# Patient Record
Sex: Female | Born: 1965 | Race: White | Hispanic: No | Smoking: Current every day smoker
Health system: Southern US, Community
[De-identification: ages and names within clinical notes are randomized; demographics above are authoritative.]

## PROBLEM LIST (undated history)

## (undated) DIAGNOSIS — F32A Depression, unspecified: Secondary | ICD-10-CM

## (undated) DIAGNOSIS — C801 Malignant (primary) neoplasm, unspecified: Secondary | ICD-10-CM

## (undated) DIAGNOSIS — F329 Major depressive disorder, single episode, unspecified: Secondary | ICD-10-CM

## (undated) DIAGNOSIS — F1010 Alcohol abuse, uncomplicated: Secondary | ICD-10-CM

## (undated) HISTORY — PX: APPENDECTOMY: SHX54

## (undated) HISTORY — PX: GASTRIC BYPASS: SHX52

## (undated) HISTORY — PX: TONSILLECTOMY: SUR1361

## (undated) HISTORY — PX: MOHS SURGERY: SUR867

---

## 2005-05-24 ENCOUNTER — Ambulatory Visit: Payer: Self-pay | Admitting: Gynecology

## 2013-02-18 ENCOUNTER — Ambulatory Visit: Payer: Self-pay

## 2014-08-28 ENCOUNTER — Ambulatory Visit
Admission: EM | Admit: 2014-08-28 | Discharge: 2014-08-28 | Disposition: A | Discharge status: Home / Self Care | Payer: Federal, State, Local not specified - PPO | Attending: Family Medicine | Admitting: Family Medicine

## 2014-08-28 ENCOUNTER — Ambulatory Visit
Admit: 2014-08-28 | Discharge: 2014-08-28 | Disposition: A | Discharge status: Home / Self Care | Payer: Federal, State, Local not specified - PPO | Attending: Family Medicine | Admitting: Family Medicine

## 2014-08-28 ENCOUNTER — Other Ambulatory Visit: Payer: Self-pay

## 2014-08-28 ENCOUNTER — Ambulatory Visit: Payer: Federal, State, Local not specified - PPO

## 2014-08-28 DIAGNOSIS — K449 Diaphragmatic hernia without obstruction or gangrene: Secondary | ICD-10-CM | POA: Diagnosis not present

## 2014-08-28 DIAGNOSIS — R079 Chest pain, unspecified: Secondary | ICD-10-CM | POA: Diagnosis not present

## 2014-08-28 DIAGNOSIS — K2970 Gastritis, unspecified, without bleeding: Secondary | ICD-10-CM | POA: Diagnosis not present

## 2014-08-28 HISTORY — DX: Malignant (primary) neoplasm, unspecified: C80.1

## 2014-08-28 HISTORY — DX: Depression, unspecified: F32.A

## 2014-08-28 HISTORY — DX: Major depressive disorder, single episode, unspecified: F32.9

## 2014-08-28 LAB — CBC WITH DIFFERENTIAL/PLATELET
BASOS ABS: 0.1 10*3/uL (ref 0–0.1)
Basophils Relative: 1 %
Eosinophils Absolute: 0.3 10*3/uL (ref 0–0.7)
Eosinophils Relative: 4 %
HEMATOCRIT: 42.8 % (ref 35.0–47.0)
Hemoglobin: 14.3 g/dL (ref 12.0–16.0)
Lymphocytes Relative: 30 %
Lymphs Abs: 2.3 10*3/uL (ref 1.0–3.6)
MCH: 29.8 pg (ref 26.0–34.0)
MCHC: 33.4 g/dL (ref 32.0–36.0)
MCV: 89.1 fL (ref 80.0–100.0)
MONOS PCT: 8 %
Monocytes Absolute: 0.6 10*3/uL (ref 0.2–0.9)
NEUTROS PCT: 57 %
Neutro Abs: 4.3 10*3/uL (ref 1.4–6.5)
Platelets: 204 10*3/uL (ref 150–440)
RBC: 4.81 MIL/uL (ref 3.80–5.20)
RDW: 13.3 % (ref 11.5–14.5)
WBC: 7.6 10*3/uL (ref 3.6–11.0)

## 2014-08-28 LAB — CK: CK TOTAL: 60 U/L (ref 38–234)

## 2014-08-28 LAB — BASIC METABOLIC PANEL
Anion gap: 10 (ref 5–15)
BUN: 11 mg/dL (ref 6–20)
CO2: 25 mmol/L (ref 22–32)
CREATININE: 0.91 mg/dL (ref 0.44–1.00)
Calcium: 8.8 mg/dL — ABNORMAL LOW (ref 8.9–10.3)
Chloride: 102 mmol/L (ref 101–111)
GFR calc non Af Amer: >60 mL/min (ref >60)
GLUCOSE: 115 mg/dL — AB (ref 65–99)
POTASSIUM: 4 mmol/L (ref 3.5–5.1)
SODIUM: 137 mmol/L (ref 135–145)

## 2014-08-28 LAB — TROPONIN I: Troponin I: <0.03 ng/mL (ref <0.031)

## 2014-08-28 LAB — FIBRIN DERIVATIVES D-DIMER (ARMC ONLY): Fibrin derivatives D-dimer (ARMC): 507 — ABNORMAL HIGH (ref 0–499)

## 2014-08-28 LAB — CKMB (ARMC ONLY): CK, MB: 1.1 ng/mL (ref 0.5–5.0)

## 2014-08-28 MED ORDER — IOHEXOL 350 MG/ML SOLN
100.0000 mL | Freq: Once | INTRAVENOUS | Status: AC | PRN
  Administered 2014-08-28: 100 mL via INTRAVENOUS

## 2014-08-28 MED ORDER — GI COCKTAIL ~~LOC~~
30.0000 mL | Freq: Once | ORAL | Status: AC
  Administered 2014-08-28: 30 mL via ORAL

## 2014-08-28 NOTE — Discharge Instructions (Signed)
Gastritis, Adult Gastritis is soreness and swelling (inflammation) of the lining of the stomach. Gastritis can develop as a sudden onset (acute) or long-term (chronic) condition. If gastritis is not treated, it can lead to stomach bleeding and ulcers. CAUSES  Gastritis occurs when the stomach lining is weak or damaged. Digestive juices from the stomach then inflame the weakened stomach lining. The stomach lining may be weak or damaged due to viral or bacterial infections. One common bacterial infection is the Helicobacter pylori infection. Gastritis can also result from excessive alcohol consumption, taking certain medicines, or having too much acid in the stomach.  SYMPTOMS  In some cases, there are no symptoms. When symptoms are present, they may include:  Pain or a burning sensation in the upper abdomen.  Nausea.  Vomiting.  An uncomfortable feeling of fullness after eating. DIAGNOSIS  Your caregiver may suspect you have gastritis based on your symptoms and a physical exam. To determine the cause of your gastritis, your caregiver may perform the following:  Blood or stool tests to check for the H pylori bacterium.  Gastroscopy. A thin, flexible tube (endoscope) is passed down the esophagus and into the stomach. The endoscope has a light and camera on the end. Your caregiver uses the endoscope to view the inside of the stomach.  Taking a tissue sample (biopsy) from the stomach to examine under a microscope. TREATMENT  Depending on the cause of your gastritis, medicines may be prescribed. If you have a bacterial infection, such as an H pylori infection, antibiotics may be given. If your gastritis is caused by too much acid in the stomach, H2 blockers or antacids may be given. Your caregiver may recommend that you stop taking aspirin, ibuprofen, or other nonsteroidal anti-inflammatory drugs (NSAIDs). HOME CARE INSTRUCTIONS  Only take over-the-counter or prescription medicines as directed by  your caregiver.  If you were given antibiotic medicines, take them as directed. Finish them even if you start to feel better.  Drink enough fluids to keep your urine clear or pale yellow.  Avoid foods and drinks that make your symptoms worse, such as:  Caffeine or alcoholic drinks.  Chocolate.  Peppermint or mint flavorings.  Garlic and onions.  Spicy foods.  Citrus fruits, such as oranges, lemons, or limes.  Tomato-based foods such as sauce, chili, salsa, and pizza.  Fried and fatty foods.  Eat small, frequent meals instead of large meals. SEEK IMMEDIATE MEDICAL CARE IF:   You have black or dark red stools.  You vomit blood or material that looks like coffee grounds.  You are unable to keep fluids down.  Your abdominal pain gets worse.  You have a fever.  You do not feel better after 1 week.  You have any other questions or concerns. MAKE SURE YOU:  Understand these instructions.  Will watch your condition.  Will get help right away if you are not doing well or get worse. Document Released: 01/26/2001 Document Revised: 08/03/2011 Document Reviewed: 03/17/2011 [facility] Patient Information 2015 [place], [place]. This information is not intended to replace advice given to you by your health care provider. Make sure you discuss any questions you have with your health care provider.   GO TO GET CT OF CHEST AT THIS TIME AT [facility].

## 2014-08-28 NOTE — ED Notes (Signed)
Pt states "I woke with morning with clavicle pain, chest tightness, shortness of breath and slightly dizzy. I did take 3 ibuprofen with some red wine before I went to bed."

## 2014-08-28 NOTE — ED Provider Notes (Addendum)
Patient presents today with symptoms of substernal chest discomfort, epigastric discomfort, mid back pain, and being slightly lightheaded when she woke up this morning. Patient states that she took 3 Ibuprofen and red wine before laying her head down for bed last night. She states she took theIibuprofen for sinus pressure she was having because she forgot to take her Allegra-D that morning. She denies any severe headache, shortness of breath, nausea, vomiting, diaphoresis. She admits to past history of smoking for 15 years. Does drink wine every night. She does have a history of acid reflux and does take Zantac for her symptoms usually. She has not taken Zantac for her symptoms that started yesterday. Today in the office she states that her symptoms are still present as far as the chest discomfort, epigastric discomfort and mid back pain. She does not appear to be in any distress. She denies any cardiac history except for high cholesterol which she does not treat with medication. She admits to family history of coronary artery disease. She denies any family history of death from cardiac source before the age of 73.  Review systems negative except mentioned above. Vitals as per Epic.  GENERAL: NAD HEENT: no pharyngeal erythema, no exudate, no erythema of TMs, no cervical LAD RESP: CTA B, no tenderness on palpation to chest  CARD: RRR MHW:[id number] bowel sounds, mild to moderate epigastric tenderness on palpation, no rebound or guarding  NEURO: CN II-XII groslly intact   A/P: Chest discomfort, epigastric discomfort-patient's symptoms improved some with GI cocktail in the office, and labs show slightly elevated blood sugar (patient was fasting), and slightly elevated d-dimer. Cardiac enzymes were negative. Patient is being sent over to [facility] for CT chest to evaluate for PE.  Paulina Fusi, MD 08/28/14 1319  CT results do not show PE. There is a possible soft tissue lesion  noted in the right lung by radiologist. Recommend repeat CT without contrast in a few weeks. This was discussed with the patient and she will follow up with her primary care physician regarding this. Patient does state that she is feeling slightly better after taking the GI cocktail. If her symptoms were to worsen she knows to go to the ER. I have advised her to avoid things that could make her reflux or gastritis worse.  Paulina Fusi, MD 08/28/14 [phone]

## 2014-08-28 NOTE — ED Notes (Signed)
Pt resting quietly, states pain in epigastric area and between clavicles resolved after adm of GI cocktail. Awaiting lab results.

## 2019-05-01 ENCOUNTER — Emergency Department
Admission: EM | Admit: 2019-05-01 | Discharge: 2019-05-01 | Disposition: A | Discharge status: Home / Self Care | Payer: Federal, State, Local not specified - PPO | Attending: Emergency Medicine | Admitting: Emergency Medicine

## 2019-05-01 ENCOUNTER — Emergency Department: Payer: Federal, State, Local not specified - PPO

## 2019-05-01 ENCOUNTER — Encounter: Payer: Self-pay | Admitting: Emergency Medicine

## 2019-05-01 ENCOUNTER — Other Ambulatory Visit: Payer: Self-pay

## 2019-05-01 DIAGNOSIS — F17210 Nicotine dependence, cigarettes, uncomplicated: Secondary | ICD-10-CM | POA: Insufficient documentation

## 2019-05-01 DIAGNOSIS — N3001 Acute cystitis with hematuria: Secondary | ICD-10-CM | POA: Insufficient documentation

## 2019-05-01 DIAGNOSIS — Z79899 Other long term (current) drug therapy: Secondary | ICD-10-CM | POA: Diagnosis not present

## 2019-05-01 DIAGNOSIS — R300 Dysuria: Secondary | ICD-10-CM | POA: Diagnosis present

## 2019-05-01 DIAGNOSIS — Z85828 Personal history of other malignant neoplasm of skin: Secondary | ICD-10-CM | POA: Insufficient documentation

## 2019-05-01 LAB — URINALYSIS, COMPLETE (UACMP) WITH MICROSCOPIC
Bacteria, UA: NONE SEEN
Bilirubin Urine: NEGATIVE
Glucose, UA: NEGATIVE mg/dL
Ketones, ur: NEGATIVE mg/dL
Nitrite: POSITIVE — AB
Protein, ur: 30 mg/dL — AB
RBC / HPF: >50 RBC/hpf — ABNORMAL HIGH (ref 0–5)
Specific Gravity, Urine: 1.021 (ref 1.005–1.030)
WBC, UA: >50 WBC/hpf — ABNORMAL HIGH (ref 0–5)
pH: 6 (ref 5.0–8.0)

## 2019-05-01 LAB — CBC WITH DIFFERENTIAL/PLATELET
Abs Immature Granulocytes: 0.05 10*3/uL (ref 0.00–0.07)
Basophils Absolute: 0.1 10*3/uL (ref 0.0–0.1)
Basophils Relative: 1 %
Eosinophils Absolute: 0.3 10*3/uL (ref 0.0–0.5)
Eosinophils Relative: 3 %
HCT: 38.6 % (ref 36.0–46.0)
Hemoglobin: 12.5 g/dL (ref 12.0–15.0)
Immature Granulocytes: 0 %
Lymphocytes Relative: 24 %
Lymphs Abs: 2.7 10*3/uL (ref 0.7–4.0)
MCH: 30.7 pg (ref 26.0–34.0)
MCHC: 32.4 g/dL (ref 30.0–36.0)
MCV: 94.8 fL (ref 80.0–100.0)
Monocytes Absolute: 1 10*3/uL (ref 0.1–1.0)
Monocytes Relative: 9 %
Neutro Abs: 7.4 10*3/uL (ref 1.7–7.7)
Neutrophils Relative %: 63 %
Platelets: 230 10*3/uL (ref 150–400)
RBC: 4.07 MIL/uL (ref 3.87–5.11)
RDW: 14.2 % (ref 11.5–15.5)
WBC: 11.6 10*3/uL — ABNORMAL HIGH (ref 4.0–10.5)
nRBC: 0 % (ref 0.0–0.2)

## 2019-05-01 LAB — BASIC METABOLIC PANEL
Anion gap: 10 (ref 5–15)
BUN: 11 mg/dL (ref 6–20)
CO2: 26 mmol/L (ref 22–32)
Calcium: 8.3 mg/dL — ABNORMAL LOW (ref 8.9–10.3)
Chloride: 106 mmol/L (ref 98–111)
Creatinine, Ser: 0.72 mg/dL (ref 0.44–1.00)
GFR calc Af Amer: >60 mL/min (ref >60)
GFR calc non Af Amer: >60 mL/min (ref >60)
Glucose, Bld: 101 mg/dL — ABNORMAL HIGH (ref 70–99)
Potassium: 4.2 mmol/L (ref 3.5–5.1)
Sodium: 142 mmol/L (ref 135–145)

## 2019-05-01 LAB — LACTIC ACID, PLASMA: Lactic Acid, Venous: 0.7 mmol/L (ref 0.5–1.9)

## 2019-05-01 MED ORDER — KETOROLAC TROMETHAMINE 30 MG/ML IJ SOLN
15.0000 mg | Freq: Once | INTRAMUSCULAR | Status: AC
  Administered 2019-05-01: 06:00:00 15 mg via INTRAVENOUS
  Filled 2019-05-01: qty 1

## 2019-05-01 MED ORDER — CEPHALEXIN 500 MG PO CAPS: 500.0000 mg | ORAL_CAPSULE | Freq: Once | ORAL | Status: DC

## 2019-05-01 MED ORDER — ONDANSETRON HCL 4 MG/2ML IJ SOLN
4.0000 mg | Freq: Once | INTRAMUSCULAR | Status: AC
  Administered 2019-05-01: 06:00:00 4 mg via INTRAVENOUS
  Filled 2019-05-01: qty 2

## 2019-05-01 MED ORDER — CEPHALEXIN 500 MG PO CAPS: 500.0000 mg | ORAL_CAPSULE | Freq: Three times a day (TID) | ORAL | 0 refills | Status: AC

## 2019-05-01 MED ORDER — SODIUM CHLORIDE 0.9 % IV SOLN
1.0000 g | Freq: Once | INTRAVENOUS | Status: AC
  Administered 2019-05-01: 1 g via INTRAVENOUS
  Filled 2019-05-01: qty 10

## 2019-05-01 NOTE — ED Triage Notes (Signed)
Pt presents to ED with sudden onset of dysuria when she woke up this morning. Reports having frequency and did notice a small amount of blood in her urine.

## 2019-05-01 NOTE — Discharge Instructions (Signed)

## 2019-05-01 NOTE — ED Provider Notes (Signed)
[facility] Emergency Department Provider Note  ____________________________________________  Time seen: Approximately 5:05 AM  I have reviewed the triage vital signs and the nursing notes.   HISTORY  Chief Complaint Dysuria   HPI April Mendez is a 54 y.o. female with a history of depression, appendectomy, gastric bypass, prior UTIs who presents for evaluation of dysuria.  Patient reports 2 to 3 days of urinary frequency.  This morning she woke up with dysuria and a small amount of blood in her urine.  She is also complaining of nausea and mild flank pain which is bilateral but worse on the left.  The pain is dull and constant since this morning.  No prior history of kidney stones.   Past Medical History:  Diagnosis Date  . 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 Depression     Past Surgical History:  Procedure Laterality Date  . APPENDECTOMY    . GASTRIC BYPASS    . MOHS SURGERY    . TONSILLECTOMY      Prior to Admission medications   Medication Sig Start Date End Date Taking? Authorizing Provider  cephALEXin (KEFLEX) 500 MG capsule Take 1 capsule (500 mg total) by mouth 3 (three) times daily for 7 days. 05/01/19 05/08/19  Rudene Re, MD  citalopram (CELEXA) 40 MG tablet Take 40 mg by mouth daily.    [provider]  fexofenadine-pseudoephedrine (ALLEGRA-D 24) 180-240 MG per 24 hr tablet Take 1 tablet by mouth daily.    [provider]  ibuprofen (ADVIL,MOTRIN) 200 MG tablet Take 200 mg by mouth every 6 (six) hours as needed.    [provider]  ranitidine (ZANTAC) 150 MG capsule Take 150 mg by mouth 2 (two) times daily.    [provider]    Allergies Prednisone  No family history on file.  Social History Social History   Tobacco Use  . Smoking status: Current Every Day Smoker  . Smokeless tobacco: Never Used  Substance Use Topics  . Alcohol use: Yes  . Drug use: No    Review of  Systems  Constitutional: Negative for fever. Eyes: Negative for visual changes. ENT: Negative for sore throat. Neck: No neck pain  Cardiovascular: Negative for chest pain. Respiratory: Negative for shortness of breath. Gastrointestinal: Negative for abdominal pain, vomiting or diarrhea. + nausea Genitourinary: + dysuria, frequency, hematuria, flank pain Musculoskeletal: Negative for back pain. Skin: Negative for rash. Neurological: Negative for headaches, weakness or numbness. Psych: No SI or HI  ____________________________________________   PHYSICAL EXAM:  VITAL SIGNS: ED Triage Vitals  Enc Vitals Group     BP 05/01/19 0351 (!) 158/80     Pulse Rate 05/01/19 0351 70     Resp 05/01/19 0351 18     Temp 05/01/19 0351 97.8 F (36.6 C)     Temp Source 05/01/19 0351 Oral     SpO2 05/01/19 0351 98 %     Weight 05/01/19 0352 140 lb (63.5 kg)     Height 05/01/19 0352 5\' 3"  (1.6 m)     Head Circumference --      Peak Flow --      Pain Score 05/01/19 0351 4     Pain Loc --      Pain Edu? --      Excl. in [place]? --     Constitutional: Alert and oriented. Well appearing and in no apparent distress. HEENT:      Head: Normocephalic and atraumatic.  Eyes: Conjunctivae are normal. Sclera is non-icteric.       Mouth/Throat: Mucous membranes are moist.       Neck: Supple with no signs of meningismus. Cardiovascular: Regular rate and rhythm. No murmurs, gallops, or rubs. 2+ symmetrical distal pulses are present in all extremities. No JVD. Respiratory: Normal respiratory effort. Lungs are clear to auscultation bilaterally. No wheezes, crackles, or rhonchi.  Gastrointestinal: Soft, mild suprapubic tenderness to palpation, and non distended with positive bowel sounds. No rebound or guarding. Genitourinary: No CVA tenderness. Musculoskeletal: Nontender with normal range of motion in all extremities. No edema, cyanosis, or erythema of extremities. Neurologic: Normal speech and  language. Face is symmetric. Moving all extremities. No gross focal neurologic deficits are appreciated. Skin: Skin is warm, dry and intact. No rash noted. Psychiatric: Mood and affect are normal. Speech and behavior are normal.  ____________________________________________   LABS (all labs ordered are listed, but only abnormal results are displayed)  Labs Reviewed  URINALYSIS, COMPLETE (UACMP) WITH MICROSCOPIC - Abnormal; Notable for the following components:      Result Value   Color, Urine YELLOW (*)    APPearance HAZY (*)    Hgb urine dipstick LARGE (*)    Protein, ur 30 (*)    Nitrite POSITIVE (*)    Leukocytes,Ua MODERATE (*)    RBC / HPF >50 (*)    WBC, UA >50 (*)    All other components within normal limits  CBC WITH DIFFERENTIAL/PLATELET - Abnormal; Notable for the following components:   WBC 11.6 (*)    All other components within normal limits  BASIC METABOLIC PANEL - Abnormal; Notable for the following components:   Glucose, Bld 101 (*)    Calcium 8.3 (*)    All other components within normal limits  URINE CULTURE  LACTIC ACID, PLASMA   ____________________________________________  EKG  none  ____________________________________________  RADIOLOGY  I have personally reviewed the images performed during this visit and I agree with the Radiologist's read.   Interpretation by Radiologist:  CT Renal Stone Study  Result Date: 05/01/2019 CLINICAL DATA:  Sudden onset dysuria when she awoke this morning EXAM: CT ABDOMEN AND PELVIS WITHOUT CONTRAST TECHNIQUE: Multidetector CT imaging of the abdomen and pelvis was performed following the standard protocol without IV contrast. COMPARISON:  None. FINDINGS: Lower chest: Lung bases are clear. Normal heart size. No pericardial effusion. Hepatobiliary: No focal liver abnormality is seen. No gallstones, gallbladder wall thickening, or biliary dilatation. Pancreas: Unremarkable. No pancreatic ductal dilatation or surrounding  inflammatory changes. Spleen: Normal in size without focal abnormality. Adrenals/Urinary Tract: Normal adrenal glands. No visible or contour deforming renal lesions. No urolithiasis or hydronephrosis. Urinary bladder is largely decompressed at time of exam with what appears to be some intramural fat in bladder base which could reflect sequela of chronic inflammation. There is mild bladder wall thickening and perivesicular hazy stranding difficult to fully assess given underdistention. Stomach/Bowel: Distal esophagus is normal. 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 Slightly patulous appearance of the jejunostomy in the left lower quadrant is a common postsurgical finding. No abnormal dilatation of the ileal pancreatic LAN is seen. No small bowel dilatation or wall thickening or features of bowel obstruction. The appendix is surgically absent. Moderate stool burden throughout the abdomen. No colonic dilatation or wall thickening. Few distal colonic diverticula without inflammation. Vascular/Lymphatic: Atherosclerotic plaque within the normal caliber aorta. No suspicious or enlarged lymph nodes in the included lymphatic chains. Reproductive: Anteverted uterus. Few intramural  fibroids at the lower uterine segment. No concerning adnexal lesions. Other: No free fluid or air. No bowel containing hernias. Musculoskeletal: No acute osseous abnormality or suspicious osseous lesion. IMPRESSION: 1. Urinary bladder is largely decompressed at time of exam with what appears to be some intramural fat in bladder base which could reflect sequela of chronic inflammation. 2. There is mild bladder wall thickening and perivesicular hazy stranding difficult to fully assess given underdistention. Recommend correlation with urinalysis to exclude the possibility of cystitis. 3. 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 4. Aortic Atherosclerosis  (ICD10-I70.0). Electronically Signed   By: Lovena Le M.D.   On: 05/01/2019 05:57      ____________________________________________   PROCEDURES  Procedure(s) performed: None Procedures Critical Care performed:  None ____________________________________________   INITIAL IMPRESSION / ASSESSMENT AND PLAN / ED COURSE   54 y.o. female with a history of depression, appendectomy, gastric bypass, prior UTIs who presents for evaluation of dysuria, frequency, hematuria, flank pain, and nausea.  Vitals are within normal limits with no fever or tachycardia.  Abdomen is soft with mild suprapubic tenderness, no CVA tenderness.  Differential diagnosis including UTI versus pyelonephritis versus kidney stones.  Urinalysis positive for UTI.  Will check labs to rule out any signs of sepsis or pyelonephritis.  We will do a CT renal to rule out kidney stones.  Will give Toradol and Rocephin.  Will send urine for culture.  _________________________ 6:27 AM on 05/01/2019 -----------------------------------------  Labs showing mild leukocytosis, normal lactic acid, no signs of sepsis.  CT renal with no evidence of kidney stone.  Presentation concerning for UTI versus early pyelonephritis.  Patient was given a dose of Rocephin and will be discharged home on Keflex.  Discussed close follow-up with PCP and my standard return precautions for flank pain, fever, vomiting, abdominal pain, or worse dysuria.      _____________________________________________ Please note:  Patient was evaluated in Emergency Department today for the symptoms described in the history of present illness. Patient was evaluated in the context of the global COVID-19 pandemic, which necessitated consideration that the patient might be at risk for infection with the SARS-CoV-2 virus that causes COVID-19. Institutional protocols and algorithms that pertain to the evaluation of patients at risk for COVID-19 are in a state of rapid change  based on information released by regulatory bodies including the CDC and federal and state organizations. These policies and algorithms were followed during the patient's care in the ED.  Some ED evaluations and interventions may be delayed as a result of limited staffing during the pandemic.   ____________________________________________   FINAL CLINICAL IMPRESSION(S) / ED DIAGNOSES   Final diagnoses:  Acute cystitis with hematuria      NEW MEDICATIONS STARTED DURING THIS VISIT:  ED Discharge Orders         Ordered    cephALEXin (KEFLEX) 500 MG capsule  3 times daily     05/01/19 0607           Note:  This document was prepared using Dragon voice recognition software and may include unintentional dictation errors. [place], [place], MD 05/01/19 [id number]

## 2019-05-01 NOTE — ED Notes (Signed)
Pt transported to CT ?

## 2019-05-03 LAB — URINE CULTURE: Culture: >=100000 — AB

## 2019-06-28 ENCOUNTER — Ambulatory Visit
Admission: RE | Admit: 2019-06-28 | Discharge: 2019-06-28 | Disposition: A | Discharge status: Home / Self Care | Payer: Federal, State, Local not specified - PPO | Source: Ambulatory Visit | Attending: Surgery | Admitting: Surgery

## 2019-06-28 ENCOUNTER — Other Ambulatory Visit: Payer: Self-pay

## 2019-06-28 ENCOUNTER — Other Ambulatory Visit: Payer: Self-pay | Admitting: Surgery

## 2019-06-28 DIAGNOSIS — F17200 Nicotine dependence, unspecified, uncomplicated: Secondary | ICD-10-CM | POA: Insufficient documentation

## 2019-08-17 ENCOUNTER — Ambulatory Visit
Admission: EM | Admit: 2019-08-17 | Discharge: 2019-08-17 | Disposition: A | Discharge status: Home / Self Care | Payer: Federal, State, Local not specified - PPO | Attending: Family Medicine | Admitting: Family Medicine

## 2019-08-17 ENCOUNTER — Other Ambulatory Visit: Payer: Self-pay

## 2019-08-17 ENCOUNTER — Encounter: Payer: Self-pay | Admitting: Emergency Medicine

## 2019-08-17 DIAGNOSIS — R1031 Right lower quadrant pain: Secondary | ICD-10-CM | POA: Diagnosis not present

## 2019-08-17 DIAGNOSIS — R6883 Chills (without fever): Secondary | ICD-10-CM | POA: Insufficient documentation

## 2019-08-17 DIAGNOSIS — R300 Dysuria: Secondary | ICD-10-CM | POA: Insufficient documentation

## 2019-08-17 DIAGNOSIS — R350 Frequency of micturition: Secondary | ICD-10-CM | POA: Diagnosis not present

## 2019-08-17 DIAGNOSIS — R1032 Left lower quadrant pain: Secondary | ICD-10-CM | POA: Insufficient documentation

## 2019-08-17 LAB — URINALYSIS, COMPLETE (UACMP) WITH MICROSCOPIC: Bacteria, UA: NONE SEEN

## 2019-08-17 MED ORDER — CIPROFLOXACIN HCL 500 MG PO TABS: 500.0000 mg | ORAL_TABLET | Freq: Two times a day (BID) | ORAL | 0 refills | Status: AC

## 2019-08-17 NOTE — ED Triage Notes (Signed)
Patient in today c/o urinary frequency and dysuria x 3 days. Patient took OTC AZO. Patient states she passed blood clots in her urine, diarrhea and vomiting this morning. Patient also c/o chills. Patient took 1 Nitrofurantoin mono/mac 100 mg left over from 07/04/19.

## 2019-08-17 NOTE — Discharge Instructions (Addendum)
Recommend start Cipro 500mg  twice a day as directed. Continue to push fluids- especially Gatorade and similar to help stay hydrated. May continue AZO as directed as needed. Follow-up pending urine culture results or go to the ER ASAP if symptoms worsen and unable to keep down any fluids.

## 2019-08-19 LAB — URINE CULTURE: Special Requests: NORMAL

## 2019-08-19 NOTE — ED Provider Notes (Signed)
[facility]    CSN: [id number] Arrival date & time: 08/17/19  1026      History   Chief Complaint Chief Complaint  Patient presents with  . Urinary Frequency  . Dysuria    HPI April Mendez is a 54 y.o. female.   54 year old female presents with urinary frequency and dysuria for the past 3 days. Symptoms are worsening with possible fever, chills, nausea, vomiting and diarrhea today. Also passing larger blood clots in her urine. Denies any back pain or unusual vaginal discharge. Has taken OTC AZO standard and a dose of Macrobid that she had left over from previous UTI with no relief. History of 3 UTI's in the past 4 months- had a UTI that grew out E. Coli on 05/01/19 that was treated with Keflex. Last UTI on 07/05/19 that grew out mixed flora and was treated with Macrobid but she admits to taking about 3 days worth of antibiotics. Other chronic health issues include mood disorder and currently on Wellbutrin and Citalopram daily.   The history is provided by the patient.    Past Medical History:  Diagnosis Date  . 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 Depression     There are no problems to display for this patient.   Past Surgical History:  Procedure Laterality Date  . APPENDECTOMY    . GASTRIC BYPASS    . MOHS SURGERY    . TONSILLECTOMY      OB History   No obstetric history on file.      Home Medications    Prior to Admission medications   Medication Sig Start Date End Date Taking? Authorizing Provider  buPROPion (WELLBUTRIN XL) 300 MG 24 hr tablet Take 300 mg by mouth every morning. 06/16/19  Yes [provider]  citalopram (CELEXA) 40 MG tablet Take 40 mg by mouth daily.   Yes [provider]  ciprofloxacin (CIPRO) 500 MG tablet Take 1 tablet (500 mg total) by mouth 2 (two) times daily for 7 days. 08/17/19 08/24/19  Katy Apo, NP  ranitidine (ZANTAC) 150 MG capsule Take 150 mg by mouth 2 (two) times daily.  08/17/19  [provider]    Family History Family History  Problem Relation Age of Onset  . Diabetes Mother   . Hypertension Mother   . Hyperlipidemia Mother   . Heart disease Father   . Hyperlipidemia Father     Social History Social History   Tobacco Use  . Smoking status: Current Every Day Smoker    Packs/day: 0.50    Years: 3.00    Pack years: 1.50    Types: Cigarettes  . Smokeless tobacco: Never Used  Vaping Use  . Vaping Use: Some days  . Substances: Nicotine, Flavoring  Substance Use Topics  . Alcohol use: Yes    Comment: 1 bottle of wine daily  . Drug use: No     Allergies   Prednisone   Review of Systems Review of Systems  Constitutional: Positive for activity change, appetite change, chills, fatigue and fever.  Gastrointestinal: Positive for abdominal pain, diarrhea, nausea and vomiting.  Genitourinary: Positive for dysuria, frequency, hematuria and urgency. Negative for genital sores and vaginal discharge.  Skin: Negative for color change, rash and wound.  Allergic/Immunologic: Negative for environmental allergies, food allergies and immunocompromised state.  Neurological: Positive for headaches. Negative for dizziness, seizures, syncope, weakness and light-headedness.  Hematological: Negative for adenopathy. Does not bruise/bleed easily.  Physical Exam Triage Vital Signs ED Triage Vitals  Enc Vitals Group     BP 08/17/19 1045 135/88     Pulse Rate 08/17/19 1045 67     Resp 08/17/19 1045 16     Temp 08/17/19 1045 97.9 F (36.6 C)     Temp Source 08/17/19 1045 Oral     SpO2 08/17/19 1045 98 %     Weight 08/17/19 1047 135 lb (61.2 kg)     Height 08/17/19 1047 5\' 3"  (1.6 m)     Head Circumference --      Peak Flow --      Pain Score 08/17/19 1045 7     Pain Loc --      Pain Edu? --      Excl. in [place]? --    No data found.  Updated Vital Signs BP 135/88 (BP Location: Left Arm)   Pulse 67   Temp 97.9 F (36.6 C) (Oral)   Resp 16   Ht 5\' 3"   (1.6 m)   Wt 135 lb (61.2 kg)   SpO2 98%   BMI 23.91 kg/m   Visual Acuity Right Eye Distance:   Left Eye Distance:   Bilateral Distance:    Right Eye Near:   Left Eye Near:    Bilateral Near:     Physical Exam Vitals and nursing note reviewed.  Constitutional:      General: She is awake. She is not in acute distress.    Appearance: She is well-developed. She is ill-appearing.     Comments: She is lying down on the exam table in no acute distress but appears ill.   HENT:     Head: Normocephalic and atraumatic.  Eyes:     Extraocular Movements: Extraocular movements intact.     Conjunctiva/sclera: Conjunctivae normal.  Cardiovascular:     Rate and Rhythm: Normal rate and regular rhythm.     Heart sounds: Normal heart sounds. No murmur heard.   Pulmonary:     Effort: Pulmonary effort is normal. No respiratory distress.     Breath sounds: Normal breath sounds and air entry. No decreased air movement. No decreased breath sounds, wheezing, rhonchi or rales.  Abdominal:     General: Abdomen is flat. Bowel sounds are normal.     Palpations: Abdomen is soft.     Tenderness: There is generalized abdominal tenderness and tenderness in the suprapubic area. There is left CVA tenderness. There is no right CVA tenderness.  Musculoskeletal:        General: Normal range of motion.     Cervical back: Normal range of motion.  Skin:    General: Skin is warm and dry.     Capillary Refill: Capillary refill takes less than 2 seconds.     Findings: No rash.  Neurological:     General: No focal deficit present.     Mental Status: She is alert and oriented to person, place, and time.  Psychiatric:        Mood and Affect: Mood normal.        Behavior: Behavior normal. Behavior is cooperative.        Thought Content: Thought content normal.        Judgment: Judgment normal.      UC Treatments / Results  Labs (all labs ordered are listed, but only abnormal results are displayed) Labs  Reviewed  URINE CULTURE - Abnormal; Notable for the following components:      Result Value  Culture MULTIPLE SPECIES PRESENT, SUGGEST RECOLLECTION (*)    All other components within normal limits  URINALYSIS, COMPLETE (UACMP) WITH MICROSCOPIC - Abnormal; Notable for the following components:   Color, Urine ORANGE (*)    APPearance CLOUDY (*)    Glucose, UA   (*)    Value: TEST NOT REPORTED DUE TO COLOR INTERFERENCE OF URINE PIGMENT   Hgb urine dipstick   (*)    Value: TEST NOT REPORTED DUE TO COLOR INTERFERENCE OF URINE PIGMENT   Bilirubin Urine   (*)    Value: TEST NOT REPORTED DUE TO COLOR INTERFERENCE OF URINE PIGMENT   Ketones, ur   (*)    Value: TEST NOT REPORTED DUE TO COLOR INTERFERENCE OF URINE PIGMENT   Protein, ur   (*)    Value: TEST NOT REPORTED DUE TO COLOR INTERFERENCE OF URINE PIGMENT   Nitrite   (*)    Value: TEST NOT REPORTED DUE TO COLOR INTERFERENCE OF URINE PIGMENT   Leukocytes,Ua   (*)    Value: TEST NOT REPORTED DUE TO COLOR INTERFERENCE OF URINE PIGMENT   All other components within normal limits    EKG   Radiology No results found.  Procedures Procedures (including critical care time)  Medications Ordered in UC Medications - No data to display  Initial Impression / Assessment and Plan / UC Course  I have reviewed the triage vital signs and the nursing notes.  Pertinent labs & imaging results that were available during my care of the patient were reviewed by me and considered in my medical decision making (see chart for details).     Reviewed urinalysis results with patient- unable to read most of urine results due to interference by AZO- did see WBC's and RBC's under the microscope. Will send urine for culture. Due to possible UTI with pyelonephritis, Will treat with Cipro 500mg  twice a day as directed. Continue AZO as directed. Try to push fluids/water- especially Gatorade. Pt declines anti-nausea medication and is tolerating oral hydrating  currently. Recommend follow-up pending urine culture results or go to the ER ASAP if symptoms worsen or unable to keep down any fluids.  Final Clinical Impressions(s) / UC Diagnoses   Final diagnoses:  Dysuria  Urinary frequency  Acute bilateral lower abdominal pain  Chills without fever     Discharge Instructions     Recommend start Cipro 500mg  twice a day as directed. Continue to push fluids- especially Gatorade and similar to help stay hydrated. May continue AZO as directed as needed. Follow-up pending urine culture results or go to the ER ASAP if symptoms worsen and unable to keep down any fluids.     ED Prescriptions    Medication Sig Dispense Auth. Provider   ciprofloxacin (CIPRO) 500 MG tablet Take 1 tablet (500 mg total) by mouth 2 (two) times daily for 7 days. 14 tablet Adynn Caseres, Nicholes Stairs, NP     PDMP not reviewed this encounter.   Katy Apo, NP 08/19/19 1650

## 2020-04-21 ENCOUNTER — Other Ambulatory Visit: Payer: Self-pay | Admitting: Obstetrics and Gynecology

## 2020-04-21 DIAGNOSIS — Z1231 Encounter for screening mammogram for malignant neoplasm of breast: Secondary | ICD-10-CM

## 2020-04-23 ENCOUNTER — Other Ambulatory Visit: Payer: Self-pay | Admitting: Physician Assistant

## 2020-04-23 DIAGNOSIS — Z1231 Encounter for screening mammogram for malignant neoplasm of breast: Secondary | ICD-10-CM

## 2020-09-03 ENCOUNTER — Other Ambulatory Visit: Payer: Self-pay | Admitting: *Deleted

## 2020-09-03 ENCOUNTER — Ambulatory Visit
Admission: RE | Admit: 2020-09-03 | Discharge: 2020-09-03 | Disposition: A | Discharge status: Home / Self Care | Payer: Federal, State, Local not specified - PPO | Source: Ambulatory Visit | Attending: *Deleted | Admitting: *Deleted

## 2020-09-03 DIAGNOSIS — R7611 Nonspecific reaction to tuberculin skin test without active tuberculosis: Secondary | ICD-10-CM

## 2021-08-31 ENCOUNTER — Emergency Department
Admission: EM | Admit: 2021-08-31 | Discharge: 2021-08-31 | Disposition: A | Discharge status: Home / Self Care | Payer: Federal, State, Local not specified - PPO | Attending: Emergency Medicine | Admitting: Emergency Medicine

## 2021-08-31 ENCOUNTER — Other Ambulatory Visit: Payer: Self-pay

## 2021-08-31 ENCOUNTER — Ambulatory Visit
Admission: EM | Admit: 2021-08-31 | Discharge: 2021-08-31 | Disposition: A | Discharge status: Other Acute Inpatient Hospital | Payer: Federal, State, Local not specified - PPO

## 2021-08-31 DIAGNOSIS — Z85828 Personal history of other malignant neoplasm of skin: Secondary | ICD-10-CM | POA: Diagnosis not present

## 2021-08-31 DIAGNOSIS — R1084 Generalized abdominal pain: Secondary | ICD-10-CM | POA: Diagnosis not present

## 2021-08-31 DIAGNOSIS — R109 Unspecified abdominal pain: Secondary | ICD-10-CM | POA: Diagnosis present

## 2021-08-31 HISTORY — DX: Alcohol abuse, uncomplicated: F10.10

## 2021-08-31 LAB — COMPREHENSIVE METABOLIC PANEL
ALT: 24 U/L (ref 0–44)
AST: 26 U/L (ref 15–41)
Albumin: 4 g/dL (ref 3.5–5.0)
Alkaline Phosphatase: 101 U/L (ref 38–126)
Anion gap: 9 (ref 5–15)
BUN: 15 mg/dL (ref 6–20)
CO2: 25 mmol/L (ref 22–32)
Calcium: 9.2 mg/dL (ref 8.9–10.3)
Chloride: 109 mmol/L (ref 98–111)
Creatinine, Ser: 0.71 mg/dL (ref 0.44–1.00)
GFR, Estimated: >60 mL/min (ref >60)
Glucose, Bld: 105 mg/dL — ABNORMAL HIGH (ref 70–99)
Potassium: 4.2 mmol/L (ref 3.5–5.1)
Sodium: 143 mmol/L (ref 135–145)
Total Bilirubin: 0.3 mg/dL (ref 0.3–1.2)
Total Protein: 7.4 g/dL (ref 6.5–8.1)

## 2021-08-31 LAB — URINALYSIS, ROUTINE W REFLEX MICROSCOPIC
Bilirubin Urine: NEGATIVE
Glucose, UA: NEGATIVE mg/dL
Hgb urine dipstick: NEGATIVE
Ketones, ur: NEGATIVE mg/dL
Leukocytes,Ua: NEGATIVE
Nitrite: NEGATIVE
Protein, ur: NEGATIVE mg/dL
Specific Gravity, Urine: 1.025 (ref 1.005–1.030)
pH: 5 (ref 5.0–8.0)

## 2021-08-31 LAB — CBC
HCT: 42.7 % (ref 36.0–46.0)
Hemoglobin: 13.3 g/dL (ref 12.0–15.0)
MCH: 28.9 pg (ref 26.0–34.0)
MCHC: 31.1 g/dL (ref 30.0–36.0)
MCV: 92.6 fL (ref 80.0–100.0)
Platelets: 264 10*3/uL (ref 150–400)
RBC: 4.61 MIL/uL (ref 3.87–5.11)
RDW: 15.2 % (ref 11.5–15.5)
WBC: 9.4 10*3/uL (ref 4.0–10.5)
nRBC: 0 % (ref 0.0–0.2)

## 2021-08-31 LAB — LIPASE, BLOOD: Lipase: 43 U/L (ref 11–51)

## 2021-08-31 MED ORDER — PANTOPRAZOLE SODIUM 20 MG PO TBEC: 20.0000 mg | DELAYED_RELEASE_TABLET | Freq: Every day | ORAL | 0 refills | Status: AC

## 2021-08-31 NOTE — ED Triage Notes (Signed)
Pt states she is a daily wine drinker and has been drinking today, states she has known liver issues and is the past several days having abd pain with N/V. Pt has a hx of gastric bypass. Pt is in NAD at present.

## 2021-08-31 NOTE — ED Provider Triage Note (Signed)
Emergency Medicine Provider Triage Evaluation Note  April Mendez , a 56 y.o. female  was evaluated in triage.  Pt complains of abdominal pain off and on with last 1 1/2 much worse.  Dry heaving but no vomitus. Last BM this am with was normal. Known liver issues and history s/p Gastric bypass 10 years ago.  Rehab one year ago for alcohol.  Review of Systems  Positive: Drinking wine daily and drinking today.   Negative: No   Physical Exam  BP (!) 153/126 (BP Location: Left Arm)   Pulse 82   Temp 98.3 F (36.8 C) (Oral)   Resp 17   Ht '5\' 3"'$  (1.6 m)   Wt 90.7 kg   SpO2 98%   BMI 35.43 kg/m  Gen:   Awake, no distress   alert and talkative Resp:  Normal effort.  Lungs are clear bilaterally. MSK:   Moves extremities without difficulty  Other:  Paresthesias sensation left lower abdomen with minimal right upper quadrant tenderness.  Medical Decision Making  Medically screening exam initiated at 12:45 PM.  Appropriate orders placed.  MAHAGONY GRIEB was informed that the remainder of the evaluation will be completed by another provider, this initial triage assessment does not replace that evaluation, and the importance of remaining in the ED until their evaluation is complete.     Johnn Hai, PA-C 08/31/21 1253

## 2021-08-31 NOTE — Discharge Instructions (Addendum)
Your liver function tests were normal.  Please start taking the Protonix once daily which will help with acid suppression and may help alleviate your symptoms of nausea and abdominal pain if it is related to gastritis.

## 2021-08-31 NOTE — ED Provider Notes (Signed)
[facility] Provider Note    Event Date/Time   First MD Initiated Contact with Patient 08/31/21 1635     (approximate)   History   Abdominal Pain   HPI  April Mendez is a 56 y.o. female  with pmh alcohol use disorder and depression presents with abdominal pain concern for elevated liver function test.  Patient drinks alcohol chronically about 2 bottles of wine and some liquor on top daily and does have morning shakes.  Has been doing this since October prior to that had been 3 months sober after inpatient rehab.  For the last 3 months she has been having some intermittent abdominal pain which she describes as a numb feeling primarily in the upper abdomen on the left but sometimes on the right.  Also feels very bloated.  Comes and goes worse after eating also has had increasing belching and retching and nausea in the morning no significant vomiting denies hematemesis blood in her stool.  No fevers chills or urinary symptoms.  Pertinent has had elevated LFTs in the past and is concerned today that she has brought on her liver from all the drinking.  Denies chest pain or shortness of breath.     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    Physical Exam  Triage Vital Signs: ED Triage Vitals  Enc Vitals Group     BP 08/31/21 1242 (!) 153/126     Pulse Rate 08/31/21 1242 82     Resp 08/31/21 1242 17     Temp 08/31/21 1242 98.3 F (36.8 C)     Temp Source 08/31/21 1242 Oral     SpO2 08/31/21 1242 98 %     Weight 08/31/21 1243 200 lb (90.7 kg)     Height 08/31/21 1243 '5\' 3"'$  (1.6 m)     Head Circumference --      Peak Flow --      Pain Score 08/31/21 1243 5     Pain Loc --      Pain Edu? --      Excl. in [place]? --     Most recent vital signs: Vitals:   08/31/21 1242 08/31/21 1646  BP: (!) 153/126 (!) 150/100  Pulse: 82 80   Resp: 17 16  Temp: 98.3 F (36.8 C) 98 F (36.7 C)  SpO2: 98% 98%     General: Awake, no distress.  CV:  Good peripheral perfusion.  Resp:  Normal effort.  Abd:  No distention.  Abdomen is soft throughout minimally tender in the epigastric region Neuro:             Awake, Alert, Oriented x 3  Other:     ED Results / Procedures / Treatments  Labs (all labs ordered are listed, but only abnormal results are displayed) Labs Reviewed  COMPREHENSIVE METABOLIC PANEL - Abnormal; Notable for the following components:      Result Value   Glucose, Bld 105 (*)    All other components within normal limits  LIPASE, BLOOD  CBC  URINALYSIS, ROUTINE W REFLEX MICROSCOPIC     EKG     RADIOLOGY    PROCEDURES:  Critical Care performed: No  Procedures   MEDICATIONS ORDERED IN ED: Medications - No data to display   IMPRESSION / MDM / ASSESSMENT  AND PLAN / ED COURSE  I reviewed the triage vital signs and the nursing notes.                              Patient's presentation is most consistent with acute complicated illness / injury requiring diagnostic workup.  Differential diagnosis includes, but is not limited to, alcoholic gastritis, hepatitis, cirrhosis, ascites, pancreatitis, biliary colic  The patient is a 56 year old female who drinks alcohol chronically presents with intermittent abdominal pain bloating belching and a.m. nausea.  This is been going on for several months who presents today because she is primarily concerned that she could have significant liver issues related to her drinking.  Vitals are within normal limits she looks well she is minimally tender in the epigastric region but abdomen is benign does not appear to have significant ascites based on exam.  Patient's labs are reassuring her LFTs are normal as is her lipase and CBC.  No liver failure currently.  Cannot rule out cirrhosis based on her LFTs but do not feel there is an indication for imaging.  Low  suspicion for acute surgical process.  Suspect patient may have a component of GERD/gastritis related to her drinking so we will start her on Protonix.  She was provided with a printout of her labs as her psychiatrist wanted to see her LFTs prior to starting additional medication for her cravings.  She is appropriate for discharge at this time       FINAL CLINICAL IMPRESSION(S) / ED DIAGNOSES   Final diagnoses:  Generalized abdominal pain     Rx / DC Orders   ED Discharge Orders          Ordered    pantoprazole (PROTONIX) 20 MG tablet  Daily        08/31/21 1752             Note:  This document was prepared using Dragon voice recognition software and may include unintentional dictation errors.   Rada Hay, MD 08/31/21 1758

## 2021-11-19 ENCOUNTER — Other Ambulatory Visit: Payer: Self-pay | Admitting: *Deleted

## 2021-11-19 ENCOUNTER — Ambulatory Visit
Admission: RE | Admit: 2021-11-19 | Discharge: 2021-11-19 | Disposition: A | Discharge status: Home / Self Care | Payer: Federal, State, Local not specified - PPO | Source: Ambulatory Visit | Attending: *Deleted | Admitting: *Deleted

## 2021-11-19 DIAGNOSIS — Z9289 Personal history of other medical treatment: Secondary | ICD-10-CM

## 2021-11-20 ENCOUNTER — Emergency Department (HOSPITAL_BASED_OUTPATIENT_CLINIC_OR_DEPARTMENT_OTHER)
Admission: EM | Admit: 2021-11-20 | Discharge: 2021-11-20 | Disposition: A | Discharge status: Home / Self Care | Payer: Federal, State, Local not specified - PPO | Attending: Emergency Medicine | Admitting: Emergency Medicine

## 2021-11-20 ENCOUNTER — Encounter (HOSPITAL_BASED_OUTPATIENT_CLINIC_OR_DEPARTMENT_OTHER): Payer: Self-pay

## 2021-11-20 ENCOUNTER — Other Ambulatory Visit: Payer: Self-pay

## 2021-11-20 ENCOUNTER — Emergency Department (HOSPITAL_BASED_OUTPATIENT_CLINIC_OR_DEPARTMENT_OTHER): Payer: Federal, State, Local not specified - PPO

## 2021-11-20 DIAGNOSIS — R748 Abnormal levels of other serum enzymes: Secondary | ICD-10-CM | POA: Insufficient documentation

## 2021-11-20 DIAGNOSIS — R1013 Epigastric pain: Secondary | ICD-10-CM | POA: Diagnosis not present

## 2021-11-20 DIAGNOSIS — R112 Nausea with vomiting, unspecified: Secondary | ICD-10-CM | POA: Diagnosis not present

## 2021-11-20 DIAGNOSIS — R109 Unspecified abdominal pain: Secondary | ICD-10-CM | POA: Diagnosis present

## 2021-11-20 LAB — URINALYSIS, ROUTINE W REFLEX MICROSCOPIC
Bilirubin Urine: NEGATIVE
Glucose, UA: NEGATIVE mg/dL
Hgb urine dipstick: NEGATIVE
Ketones, ur: NEGATIVE mg/dL
Leukocytes,Ua: NEGATIVE
Nitrite: NEGATIVE
Protein, ur: NEGATIVE mg/dL
Specific Gravity, Urine: 1.01 (ref 1.005–1.030)
pH: 5 (ref 5.0–8.0)

## 2021-11-20 LAB — COMPREHENSIVE METABOLIC PANEL
ALT: 25 U/L (ref 0–44)
AST: 31 U/L (ref 15–41)
Albumin: 4.2 g/dL (ref 3.5–5.0)
Alkaline Phosphatase: 83 U/L (ref 38–126)
Anion gap: 10 (ref 5–15)
BUN: 13 mg/dL (ref 6–20)
CO2: 25 mmol/L (ref 22–32)
Calcium: 9.3 mg/dL (ref 8.9–10.3)
Chloride: 104 mmol/L (ref 98–111)
Creatinine, Ser: 0.96 mg/dL (ref 0.44–1.00)
GFR, Estimated: >60 mL/min (ref >60)
Glucose, Bld: 94 mg/dL (ref 70–99)
Potassium: 3.9 mmol/L (ref 3.5–5.1)
Sodium: 139 mmol/L (ref 135–145)
Total Bilirubin: 0.3 mg/dL (ref 0.3–1.2)
Total Protein: 7.1 g/dL (ref 6.5–8.1)

## 2021-11-20 LAB — CBC
HCT: 38.2 % (ref 36.0–46.0)
Hemoglobin: 12.2 g/dL (ref 12.0–15.0)
MCH: 30 pg (ref 26.0–34.0)
MCHC: 31.9 g/dL (ref 30.0–36.0)
MCV: 94.1 fL (ref 80.0–100.0)
Platelets: 220 10*3/uL (ref 150–400)
RBC: 4.06 MIL/uL (ref 3.87–5.11)
RDW: 14.8 % (ref 11.5–15.5)
WBC: 9.8 10*3/uL (ref 4.0–10.5)
nRBC: 0 % (ref 0.0–0.2)

## 2021-11-20 LAB — LIPASE, BLOOD: Lipase: 206 U/L — ABNORMAL HIGH (ref 11–51)

## 2021-11-20 MED ORDER — ONDANSETRON HCL 4 MG/2ML IJ SOLN
4.0000 mg | Freq: Once | INTRAMUSCULAR | Status: AC
  Administered 2021-11-20: 4 mg via INTRAVENOUS
  Filled 2021-11-20: qty 2

## 2021-11-20 MED ORDER — SUCRALFATE 1 G PO TABS: 1.0000 g | ORAL_TABLET | Freq: Three times a day (TID) | ORAL | 0 refills | Status: AC

## 2021-11-20 MED ORDER — IOHEXOL 300 MG/ML  SOLN
100.0000 mL | Freq: Once | INTRAMUSCULAR | Status: AC | PRN
  Administered 2021-11-20: 100 mL via INTRAVENOUS

## 2021-11-20 MED ORDER — MORPHINE SULFATE (PF) 4 MG/ML IV SOLN
4.0000 mg | Freq: Once | INTRAVENOUS | Status: DC
  Filled 2021-11-20: qty 1

## 2021-11-20 MED ORDER — FAMOTIDINE IN NACL 20-0.9 MG/50ML-% IV SOLN
20.0000 mg | Freq: Once | INTRAVENOUS | Status: AC
  Administered 2021-11-20: 20 mg via INTRAVENOUS
  Filled 2021-11-20: qty 50

## 2021-11-20 MED ORDER — ONDANSETRON HCL 4 MG PO TABS: 4.0000 mg | ORAL_TABLET | ORAL | 0 refills | Status: AC | PRN

## 2021-11-20 NOTE — ED Provider Notes (Signed)
[facility] Provider Note   CSN: [id number] Arrival date & time: 11/20/21  1317     History  Chief Complaint  Patient presents with   Abdominal Pain    April Mendez is a 56 y.o. female.  Patient is a 56 year old female with a past medical history of alcohol use disorder currently in rehab previous Roux-en-Y and appendectomy presenting to the emergency department with abdominal pain.  Patient states over the last 4 months she has had increasing abdominal pain.  She states that it feels like a band type pain across her abdomen and intermittently will have sharp pain with associated nausea.  She states that she feels bloated and has had a 35 pound weight gain in the last 4 months.  She states that last night her pain got significantly worse and she started vomiting which prompted her to come to the emergency department.  She states that she has been sober from alcohol for the last 9 days.  She denies any fevers or chills, diarrhea or constipation, dysuria or hematuria.  She states she has not tried anything for the pain but does take a daily antacid.  States that she was seen in the ER for her pain about 1 month ago and was told that she had gastroenteritis but she states that her pain has not improved at all since then.  The history is provided by the patient.  Abdominal Pain      Home Medications Prior to Admission medications   Medication Sig Start Date End Date Taking? Authorizing Provider  buPROPion (WELLBUTRIN XL) 300 MG 24 hr tablet Take 300 mg by mouth every morning. 06/16/19   [provider]  citalopram (CELEXA) 40 MG tablet Take 40 mg by mouth daily.    [provider]  pantoprazole (PROTONIX) 20 MG tablet Take 1 tablet (20 mg total) by mouth daily. 08/31/21 09/30/21  Rada Hay, MD  ranitidine (ZANTAC) 150 MG capsule Take 150 mg by mouth 2 (two) times daily.  08/17/19  [provider]      Allergies    Prednisone     Review of Systems   Review of Systems  Gastrointestinal:  Positive for abdominal pain.    Physical Exam Updated Vital Signs BP (!) 160/106   Pulse 74   Temp 98.1 F (36.7 C) (Oral)   Resp 18   Ht '5\' 3"'$  (1.6 m)   Wt 90.7 kg   SpO2 98%   BMI 35.42 kg/m  Physical Exam Vitals and nursing note reviewed.  Constitutional:      General: She is not in acute distress.    Appearance: She is well-developed. She is obese.  HENT:     Head: Normocephalic and atraumatic.     Mouth/Throat:     Mouth: Mucous membranes are moist.     Pharynx: Oropharynx is clear.  Eyes:     Extraocular Movements: Extraocular movements intact.     Pupils: Pupils are equal, round, and reactive to light.  Cardiovascular:     Rate and Rhythm: Normal rate and regular rhythm.     Heart sounds: Normal heart sounds.  Pulmonary:     Effort: Pulmonary effort is normal.     Breath sounds: Normal breath sounds.  Abdominal:     General: Abdomen is flat.     Palpations: Abdomen is soft.     Tenderness: There is abdominal tenderness in the epigastric area. There is no right CVA tenderness, left CVA tenderness,  guarding or rebound.  Skin:    General: Skin is warm and dry.  Neurological:     General: No focal deficit present.     Mental Status: She is alert and oriented to person, place, and time.  Psychiatric:        Mood and Affect: Mood normal.        Behavior: Behavior normal.     ED Results / Procedures / Treatments   Labs (all labs ordered are listed, but only abnormal results are displayed) Labs Reviewed  LIPASE, BLOOD - Abnormal; Notable for the following components:      Result Value   Lipase 206 (*)    All other components within normal limits  COMPREHENSIVE METABOLIC PANEL  CBC  URINALYSIS, ROUTINE W REFLEX MICROSCOPIC    EKG EKG Interpretation  Date/Time:  Friday November 20 2021 13:35:17 EDT Ventricular Rate:  70 PR Interval:  130 QRS Duration: 74 QT Interval:  388 QTC  Calculation: 419 R Axis:   40 Text Interpretation: Normal sinus rhythm Possible Anterior infarct , age undetermined Abnormal ECG When compared with ECG of 28-Aug-2014 09:31, Nonspecific T wave abnormality now evident in Anterior leads twi in lead 3, v3 not seen on prior no stemi Confirmed by Wynona Dove ([id number]) on 11/20/2021 3:30:52 PM  Radiology DG Chest 2 View  Result Date: 11/20/2021 CLINICAL DATA:  History of positive PPD. EXAM: CHEST - 2 VIEW COMPARISON:  Chest x-ray dated 09/03/2020 FINDINGS: Heart size and mediastinal contours are within normal limits. Lungs are clear. No pleural effusion is seen. Osseous structures about the chest are unremarkable. IMPRESSION: No active cardiopulmonary disease. No evidence of active TB. Electronically Signed   By: Franki Cabot M.D.   On: 11/20/2021 14:21    Procedures Procedures    Medications Ordered in ED Medications  famotidine (PEPCID) IVPB 20 mg premix (has no administration in time range)  ondansetron (ZOFRAN) injection 4 mg (4 mg Intravenous Given 11/20/21 1500)  iohexol (OMNIPAQUE) 300 MG/ML solution 100 mL (100 mLs Intravenous Contrast Given 11/20/21 1519)    ED Course/ Medical Decision Making/ A&P Clinical Course as of 11/20/21 1531  Fri Nov 20, 2021  1531 Patient signed out to Dr. Pearline Cables pending CT read and reassessment.  [VK]    Clinical Course User Index [VK] Kemper Durie, DO                           Medical Decision Making This patient presents to the ED with chief complaint(s) of abdominal pain with pertinent past medical history of alcohol use disorder currently in rehab, prior Roux-en-Y and appendectomy which further complicates the presenting complaint. The complaint involves an extensive differential diagnosis and also carries with it a high risk of complications and morbidity.    The differential diagnosis includes pancreatitis, cholelithiasis, cholecystitis less likely due to no fever and the chronicity of the pain,  SBO less likely as the patient's having normal bowel movements  Additional history obtained: Additional history obtained from N/A Records reviewed previous ED records  ED Course and Reassessment: Patient was initially evaluated by triage and had labs performed that does show an elevated lipase, concerning for pancreatitis.  Due to the chronicity of the pain and bloating symptoms, concern for possible complications of pancreatitis and will have CT of abdomen and pelvis performed.  She will have Zofran for nausea and denied any pain medication at this time.  Independent labs interpretation:  The following  labs were independently interpreted: Evaded lipase otherwise within normal range  Independent visualization of imaging: Pending     Amount and/or Complexity of Data Reviewed Labs: ordered. Radiology: ordered.  Risk Prescription drug management.          Final Clinical Impression(s) / ED Diagnoses Final diagnoses:  None    Rx / DC Orders ED Discharge Orders     None         Kemper Durie, DO 11/20/21 1531

## 2021-11-20 NOTE — ED Triage Notes (Signed)
Patient BIB [facility] from [facility].  Endorses Epigastric and RUQ ABD Pain for 4 Months and today had an Episode of N/V. Associated with Loss of Appetite shortly after beginning Eating.   VSS with [facility] but Hypertensive at 505-183 Systolic.   NAD Noted during Triage. A&Ox4. GCS 15. Ambulatory.

## 2021-11-20 NOTE — ED Provider Notes (Signed)
  Provider Note MRN:  [id number]  Arrival date & time: 11/20/21    ED Course and Medical Decision Making  Assumed care from [place] at shift change.  See note from prior team for complete details, in brief:  56 yo female Hx eoth abuse  Abx pain x4 mos Pain worsened in last 24 hours, n/v Lipase mild + 206 Labs o/w stable CTAP pending  Plan per prior physician f/u CT, po challenge   CT resulted, reviewed by myself, no acute abnormalities Pt tolerating PO w/o difficulty Feels much better after carafate Rpt abd exam is soft, non-tender, not peritoneal.  Concern for possible gastritis vs mild pancreatitis Recommend rehydration, carafate for home, continue carafate, bland diet, f/u with her pcp  The patient improved significantly and was discharged in stable condition. Detailed discussions were had with the patient regarding current findings, and need for close f/u with PCP or on call doctor. The patient has been instructed to return immediately if the symptoms worsen in any way for re-evaluation. Patient verbalized understanding and is in agreement with current care plan. All questions answered prior to discharge.       Procedures  Final Clinical Impressions(s) / ED Diagnoses     ICD-10-CM   1. Epigastric pain  R10.13       ED Discharge Orders          Ordered    sucralfate (CARAFATE) 1 g tablet  3 times daily with meals & bedtime        11/20/21 1610    ondansetron (ZOFRAN) 4 MG tablet  Every 4 hours PRN        11/20/21 1612              Discharge Instructions      It was a pleasure caring for you today in the emergency department.  Please return to the emergency department for any worsening or worrisome symptoms.  Would also recommend you follow-up with your bariatric surgeon regarding your post-op concerns.  Please  continue the protonix you are already taking         Jeanell Sparrow, DO 11/20/21 1614

## 2021-11-20 NOTE — Discharge Instructions (Addendum)
It was a pleasure caring for you today in the emergency department.  Please return to the emergency department for any worsening or worrisome symptoms.  Would also recommend you follow-up with your bariatric surgeon regarding your post-op concerns.  Please  continue the protonix you are already taking

## 2022-03-14 IMAGING — CT CT RENAL STONE PROTOCOL
2 of 4 series · 16 of 46 positions shown, 18 images · non-contrast
Comparison: None.

CLINICAL DATA: Sudden onset dysuria when she awoke this morning

EXAM:
CT ABDOMEN AND PELVIS WITHOUT CONTRAST
TECHNIQUE: Multidetector CT imaging of the abdomen and pelvis was performed
following the standard protocol without IV contrast.

[Series 2: stone full standard · axial · 0.67mm/px · z∈[-458,-38]mm · 13 of 92 slices shown, 15 images]
[im 4/92  soft-tissue]
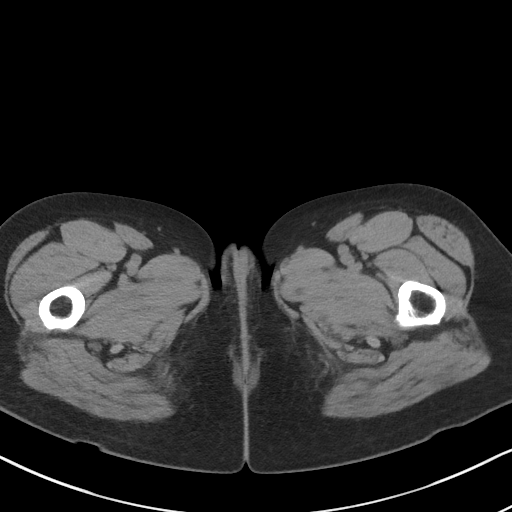
[im 4/92  bone]
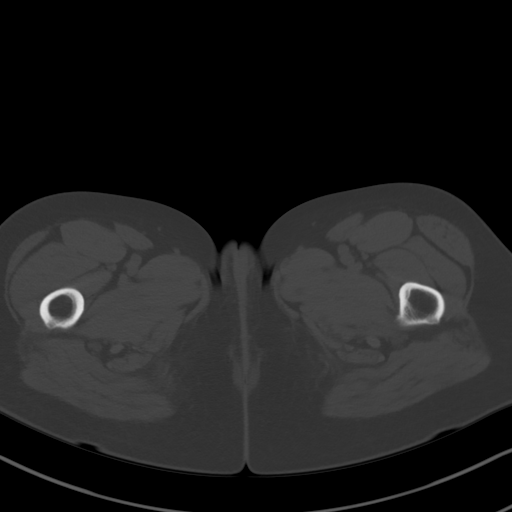
[im 12/92  soft-tissue]
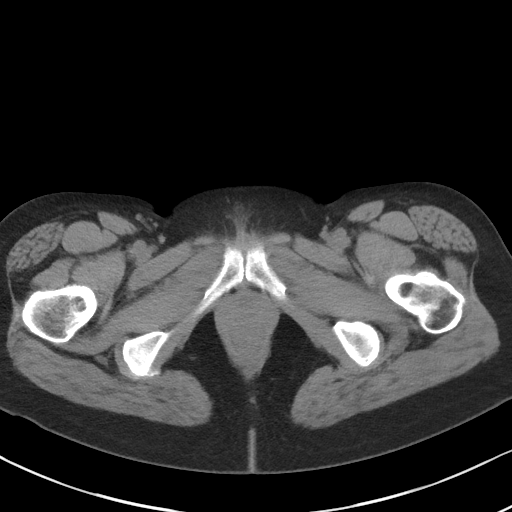
[im 19/92  soft-tissue]
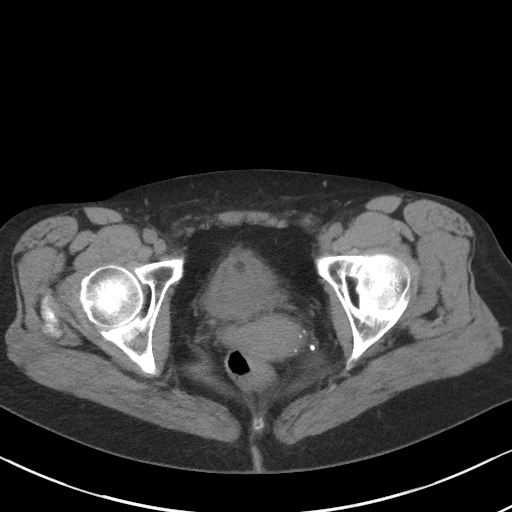
[im 27/92  soft-tissue]
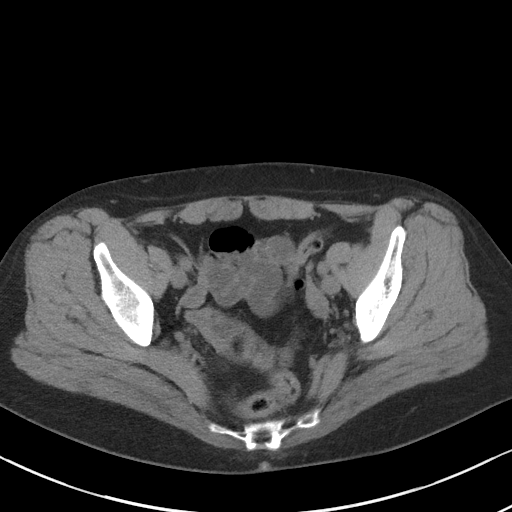
[im 31/92  soft-tissue]
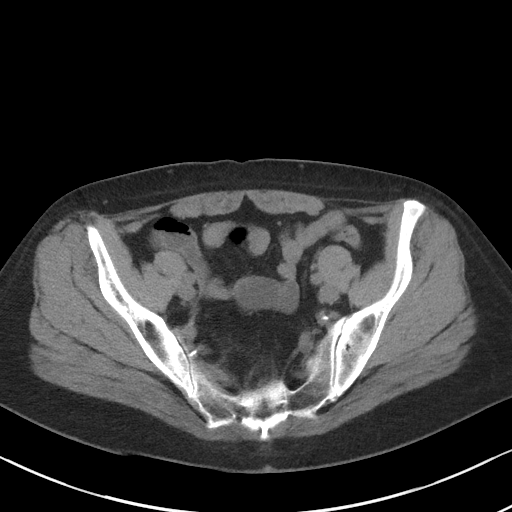
[im 38/92  soft-tissue]
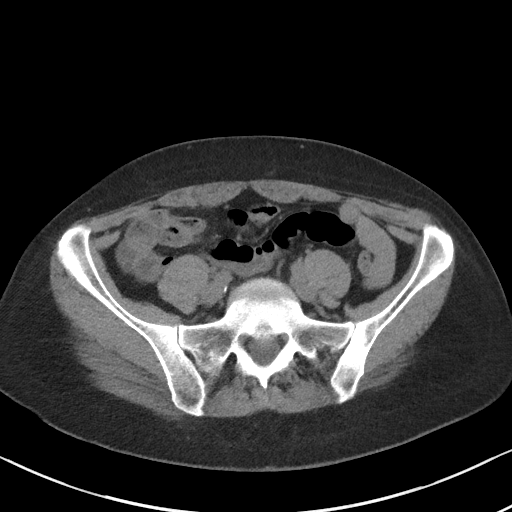
[im 46/92  soft-tissue]
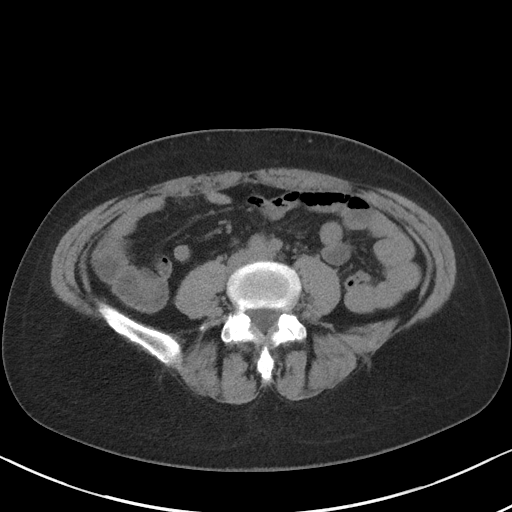
[im 54/92  soft-tissue]
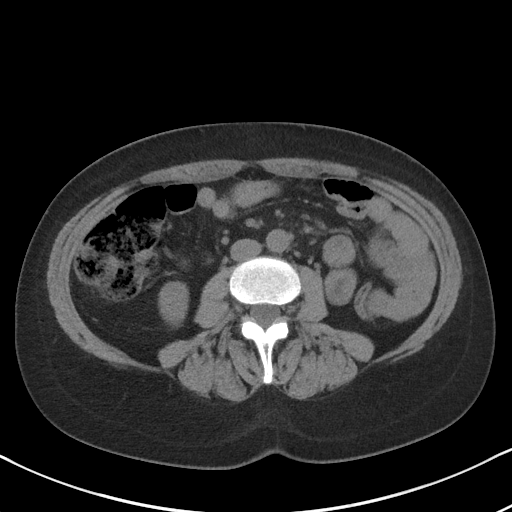
[im 61/92  soft-tissue]
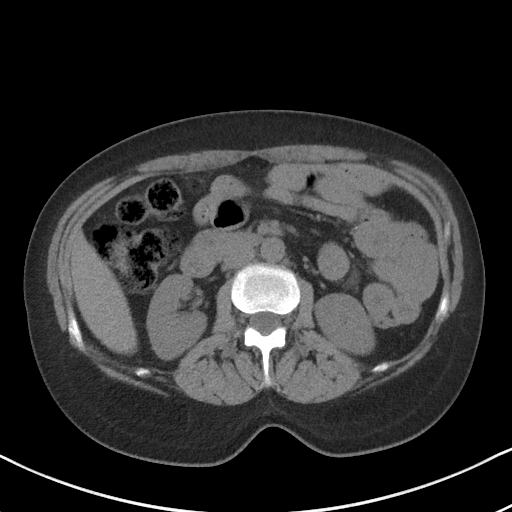
[im 61/92  bone]
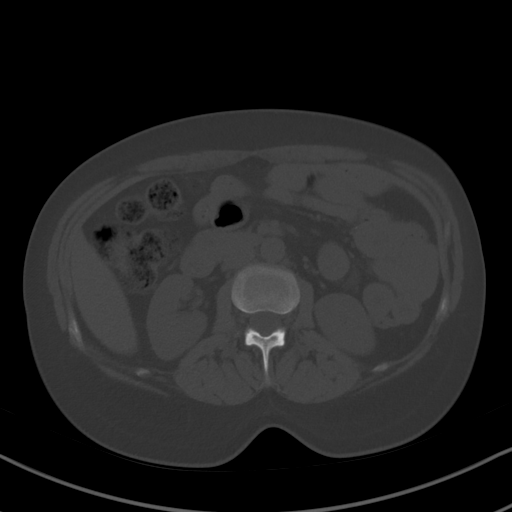
[im 65/92  soft-tissue]
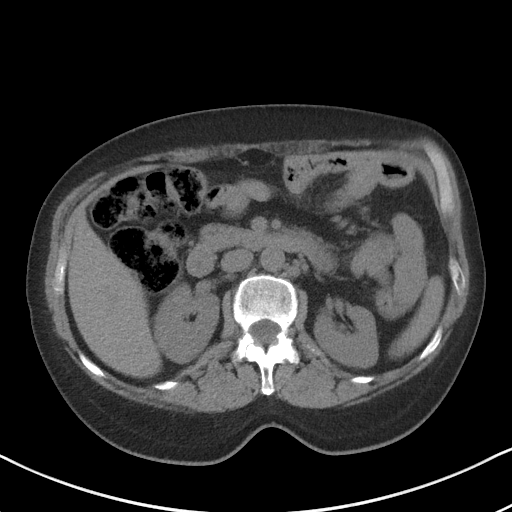
[im 73/92  soft-tissue]
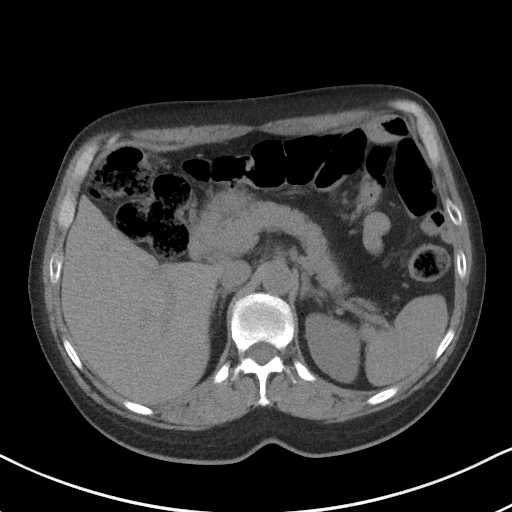
[im 80/92  soft-tissue]
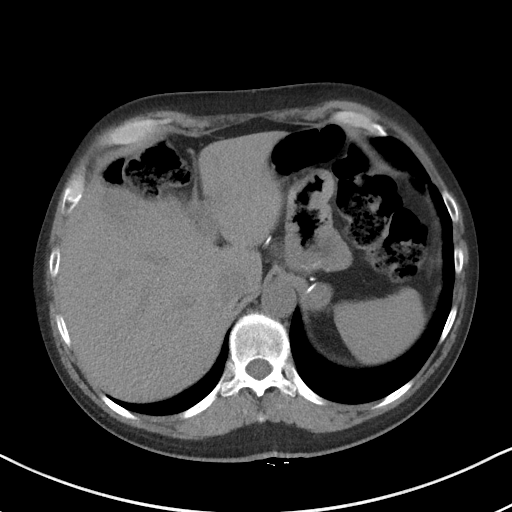
[im 88/92  soft-tissue]
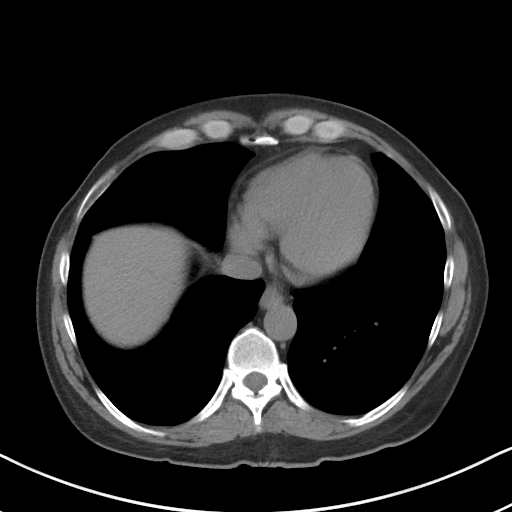

[Series 5: coronal · coronal · 0.83mm/px · 3 of 107 slices shown]
[im 36/107  soft-tissue]
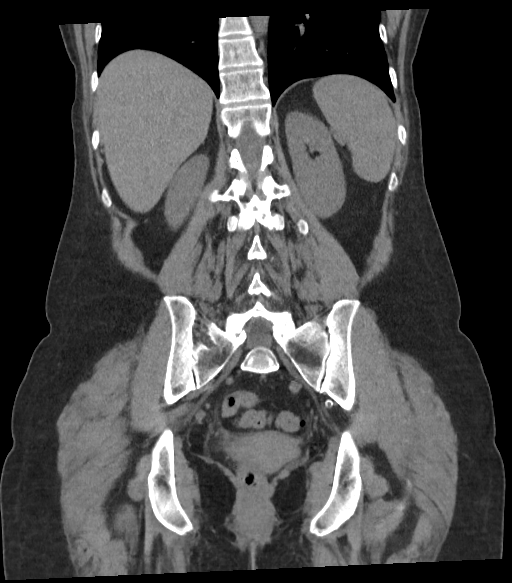
[im 48/107  soft-tissue]
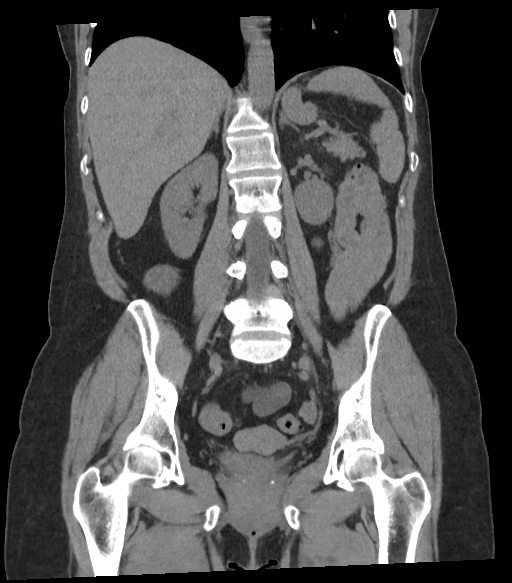
[im 59/107  soft-tissue]
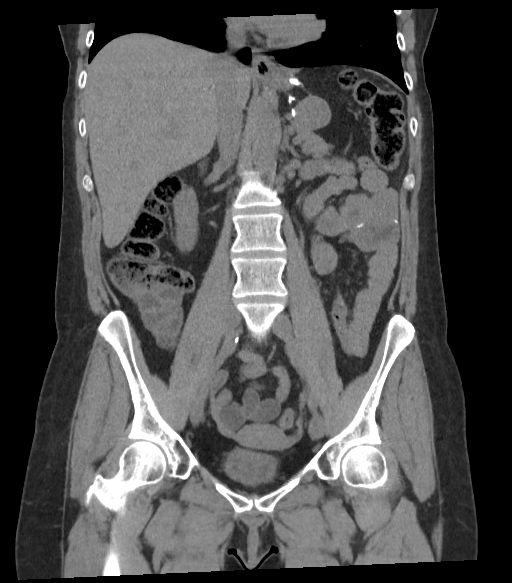

[16 of 46 positions shown; findings below may reference images not displayed]

FINDINGS: Lower chest: Lung bases are clear. Normal heart size. No pericardial
effusion.

Hepatobiliary: No focal liver abnormality is seen. No gallstones,
gallbladder wall thickening, or biliary dilatation.

Pancreas: Unremarkable. No pancreatic ductal dilatation or
surrounding inflammatory changes.

Spleen: Normal in size without focal abnormality.

Adrenals/Urinary Tract: Normal adrenal glands. No visible or contour
deforming renal lesions. No urolithiasis or hydronephrosis. Urinary
bladder is largely decompressed at time of exam with what appears to
be some intramural fat in bladder base which could reflect sequela
of chronic inflammation. There is mild bladder wall thickening and
perivesicular hazy stranding difficult to fully assess given
underdistention.

Stomach/Bowel: Distal esophagus is normal. There are postsurgical
changes from an antecolic Roux-en-Y gastric bypass. Slightly
patulous appearance of the jejunostomy in the left lower quadrant is
a common postsurgical finding. No abnormal dilatation of the ileal
pancreatic ADOMAITE is seen. No small bowel dilatation or wall thickening
or features of bowel obstruction. The appendix is surgically absent.
Moderate stool burden throughout the abdomen. No colonic dilatation
or wall thickening. Few distal colonic diverticula without
inflammation.

Vascular/Lymphatic: Atherosclerotic plaque within the normal caliber
aorta. No suspicious or enlarged lymph nodes in the included
lymphatic chains.

Reproductive: Anteverted uterus. Few intramural fibroids at the
lower uterine segment. No concerning adnexal lesions.

Other: No free fluid or air. No bowel containing hernias.

Musculoskeletal: No acute osseous abnormality or suspicious osseous
lesion.
IMPRESSION: 1. Urinary bladder is largely decompressed at time of exam with what
appears to be some intramural fat in bladder base which could
reflect sequela of chronic inflammation.
2. There is mild bladder wall thickening and perivesicular hazy
stranding difficult to fully assess given underdistention. Recommend
correlation with urinalysis to exclude the possibility of cystitis.
3. Postsurgical changes from an antecolic Roux-en-Y gastric bypass
without features of bowel obstruction.
4. Aortic Atherosclerosis ([id number]).
